# Patient Record
Sex: Male | Born: 1955 | Race: White | Hispanic: No | Marital: Married | Smoking: Never smoker
Health system: Southern US, Community
[De-identification: ages and names within clinical notes are randomized; demographics above are authoritative.]

## PROBLEM LIST (undated history)

## (undated) DIAGNOSIS — E7800 Pure hypercholesterolemia, unspecified: Secondary | ICD-10-CM

---

## 2011-10-30 ENCOUNTER — Encounter: Payer: Self-pay | Admitting: *Deleted

## 2011-10-30 ENCOUNTER — Emergency Department (HOSPITAL_BASED_OUTPATIENT_CLINIC_OR_DEPARTMENT_OTHER)
Admission: EM | Admit: 2011-10-30 | Discharge: 2011-10-30 | Disposition: A | Discharge status: Home / Self Care | Payer: No Typology Code available for payment source | Attending: Emergency Medicine | Admitting: Emergency Medicine

## 2011-10-30 ENCOUNTER — Other Ambulatory Visit: Payer: Self-pay

## 2011-10-30 ENCOUNTER — Emergency Department (INDEPENDENT_AMBULATORY_CARE_PROVIDER_SITE_OTHER): Payer: No Typology Code available for payment source

## 2011-10-30 DIAGNOSIS — R42 Dizziness and giddiness: Secondary | ICD-10-CM

## 2011-10-30 DIAGNOSIS — E7800 Pure hypercholesterolemia, unspecified: Secondary | ICD-10-CM | POA: Insufficient documentation

## 2011-10-30 DIAGNOSIS — R079 Chest pain, unspecified: Secondary | ICD-10-CM | POA: Insufficient documentation

## 2011-10-30 HISTORY — DX: Pure hypercholesterolemia, unspecified: E78.00

## 2011-10-30 LAB — CARDIAC PANEL(CRET KIN+CKTOT+MB+TROPI)
Relative Index: INVALID (ref 0.0–2.5)
Relative Index: INVALID (ref 0.0–2.5)
Total CK: 86 U/L (ref 7–232)
Total CK: 81 U/L (ref 7–232)
Troponin I: <0.3 ng/mL (ref <0.30)
Troponin I: <0.3 ng/mL (ref <0.30)

## 2011-10-30 LAB — DIFFERENTIAL
Lymphocytes Relative: 18 % (ref 12–46)
Lymphs Abs: 1.8 10*3/uL (ref 0.7–4.0)
Monocytes Absolute: 0.8 10*3/uL (ref 0.1–1.0)
Monocytes Relative: 8 % (ref 3–12)
Neutro Abs: 7.3 10*3/uL (ref 1.7–7.7)
Neutrophils Relative %: 73 % (ref 43–77)

## 2011-10-30 LAB — BASIC METABOLIC PANEL
BUN: 14 mg/dL (ref 6–23)
CO2: 29 mEq/L (ref 19–32)
Chloride: 102 mEq/L (ref 96–112)
Creatinine, Ser: 0.8 mg/dL (ref 0.50–1.35)
GFR calc Af Amer: >90 mL/min (ref >90)
Potassium: 4.1 mEq/L (ref 3.5–5.1)

## 2011-10-30 LAB — CBC
HCT: 43 % (ref 39.0–52.0)
Hemoglobin: 15.2 g/dL (ref 13.0–17.0)
RBC: 4.94 MIL/uL (ref 4.22–5.81)
WBC: 10 10*3/uL (ref 4.0–10.5)

## 2011-10-30 MED ORDER — ONDANSETRON HCL 4 MG/2ML IJ SOLN
4.0000 mg | Freq: Once | INTRAMUSCULAR | Status: AC
Start: 2011-10-30 — End: 2011-10-30
  Administered 2011-10-30: 4 mg via INTRAVENOUS
  Filled 2011-10-30: qty 2

## 2011-10-30 MED ORDER — GI COCKTAIL ~~LOC~~
30.0000 mL | Freq: Once | ORAL | Status: AC
  Administered 2011-10-30: 30 mL via ORAL
  Filled 2011-10-30: qty 30

## 2011-10-30 MED ORDER — ASPIRIN 81 MG PO CHEW: 324.0000 mg | CHEWABLE_TABLET | Freq: Once | ORAL | Status: DC

## 2011-10-30 NOTE — ED Notes (Signed)
Pt took asa prior to arrival

## 2011-10-30 NOTE — ED Notes (Signed)
EDP was back in with pt-pt cont'd denies c/o

## 2011-10-30 NOTE — ED Notes (Signed)
PT TO HALL BED 1 BY EMS VIA STRETCHER, PT REPORTS ONE HOUR LONG EPISODE OF CHEST PRESSURE, DIAHPHORESIS, AND NAUSEA, PT STATES HE TOOK ZANTAC AND FELT HE GOT SOME RELIEF FROM IT. PT HAD ANOTHER EPISODE TODAY, NO CHEST PRESSURE, BUT FELT "FLUSHED" AND NAUSEATED. PER PT COMPANION HE LOOKED "SWEATY", AND PT STATES HE "FELT FUNNY". EMS REPORTS EKG SHOWS NSR, REPEAT EKG IN PROGRESS WHILE PT BEING TRIAGED.

## 2011-10-30 NOTE — ED Provider Notes (Signed)
History  This chart was scribed for Glynn Octave, MD by Bennett Scrape. This patient was seen in room MH10/MH10 and the patient's care was started at 4:50PM.  CSN: [id number] Arrival date & time: 10/30/2011  4:39 PM   First MD Initiated Contact with Patient 10/30/11 1639      Chief Complaint  Patient presents with  . Chest Pain     The history is provided by the patient. No language interpreter was used.   Roberto Taylor is a 55 y.o. male brought in by ambulance, who presents to the Emergency Department complaining of two episodes of one hour sharp and pressure chest pain felt across the top part of the chest, diaphoresis and  light-headedness with one episode of diarrhea that occurred over the past 3 days. The first episode started while the pt was asleep. Pt states that he took a Zantac during the first episode 2 days ago with improvement in his symptoms. Pt states that he had no symptoms yesterday. Pt states that he experienced the same symptoms today after attending some meetings in [facility] about 2.5 hours ago. Pt states that he excused himself to go to the bathroom after he began to feel diaphoretic and lightheaded. He states that he started falling when came out of the bathroom, but was caught by a co-worker who then drove him to an Urgent Care in [place]. An EKG was performed there and he was then transferred to this ED. Pt states that he has a h/o heart disease in his family, but denies  having any h/o heart problems himself. Pt states that he had negative stress test 12 years ago. Pt denies smoking and alcohol use.   Pt's PCP is Dr. Forestine Na.  Past Medical History  Diagnosis Date  . Hypercholesteremia     History reviewed. No pertinent past surgical history.  History reviewed. No pertinent family history.  History  Substance Use Topics  . Smoking status: Never Smoker   . Smokeless tobacco: Not on file  . Alcohol Use: No      Review of Systems A complete 10  system review of systems was obtained and is otherwise negative except as noted in the HPI.   Allergies  Review of patient's allergies indicates no known allergies.  Home Medications   Current Outpatient Rx  Name Route Sig Dispense Refill  . ASPIRIN 81 MG PO CHEW Oral Chew 324 mg by mouth once.      Marland Kitchen RANITIDINE HCL 75 MG PO TABS Oral Take 75 mg by mouth once.      . INFLUENZA VIRUS VACC SPLIT PF IM SUSP Intramuscular Inject 0.5 mLs into the muscle once.        Triage Vitals: BP 121/73  Pulse 91  Temp(Src) 97.5 F (36.4 C) (Oral)  Resp 20  SpO2 98%  Physical Exam  Nursing note and vitals reviewed. Constitutional: He is oriented to person, place, and time. He appears well-developed and well-nourished.  HENT:  Head: Normocephalic and atraumatic.  Eyes: Conjunctivae and EOM are normal. Pupils are equal, round, and reactive to light.  Neck: Normal range of motion. Neck supple.  Cardiovascular: Normal rate, regular rhythm and normal heart sounds.   Pulmonary/Chest: Effort normal and breath sounds normal.  Abdominal: Soft. Bowel sounds are normal. There is no tenderness.  Musculoskeletal: Normal range of motion. He exhibits no edema.  Neurological: He is alert and oriented to person, place, and time.  Skin: Skin is warm and dry.  ED Course  Procedures (including critical care time)  DIAGNOSTIC STUDIES: Oxygen Saturation is 98% on room air, normal by my interpretation.    COORDINATION OF CARE: 4:54PM-Discussed possible admission Requested to be transferred to [facility] if necessary because of family. 5:54PM-Discussed negative test results with pt and pt acknowledged results. Advised pt to be seen at [facility]. Pt would rather be sent home and be seen tomorrow by PCP. Agreed upon another set of blood tests and then discharge pt home. Pt will be seen as an outpatient tomorrow for stress test. 8:49PM-Discussed negative test findings. Pt is feeling better and is comfortable going  home. Pt will follow-up with PCP within the next week and will seek out a Cardiologist.   Labs Reviewed  CBC  DIFFERENTIAL  BASIC METABOLIC PANEL  CARDIAC PANEL(CRET KIN+CKTOT+MB+TROPI)  CARDIAC PANEL(CRET KIN+CKTOT+MB+TROPI)   Dg Chest 2 View  10/30/2011  *RADIOLOGY REPORT*  Clinical Data: Chest pains, lightheadedness, sweating  CHEST - 2 VIEW  Comparison: None.  Findings: The lungs are clear.  Mediastinal contours appear normal. The heart is within normal limits in size.  No bony abnormality is seen.  IMPRESSION: No active lung disease.  Original Report Authenticated By: Juline Patch, M.D.     1. Chest pain       MDM  Patient presents with 2 episodes of chest pressure, nausea, diaphoresis, lightheadedness and flushed feeling. States he is feeling better but still there but at baseline. He currently denies any chest pain. The symptoms are concerning for anginal equivalent.   Date: 10/30/2011  Rate: 89  Rhythm: normal sinus rhythm  QRS Axis: normal  Intervals: normal  ST/T Wave abnormalities: normal  Conduction Disutrbances:none  Narrative Interpretation:   Old EKG Reviewed: none available  First set of enzymes negative patient is feeling better.  I discussed with him my low suspicion for cardiac etiology of his discomfort. Patient's unwilling to stay for any type of provocative testing.  His TIMI score is 0. his heart score is 1.  He states he'll followup with his doctor for an outpatient stress test he is agreeable to obtain a second set of cardiac enzymes.  This meets the [facility] guidelines for low risk chest pain with followup provocative testing in 48-72 hours.  Two sets of enzymes negative.  Patient feeling at baseline.  No further symptoms.  States GI cocktail helped.  Still not agreeable tos staying overnight.  He states he will follow up with his doctor tomorrow.   I personally performed the services described in this documentation, which was scribed in my presence.  The  recorded information has been reviewed and considered.   Glynn Octave, MD 10/31/11 [phone]

## 2012-11-13 IMAGING — CR DG CHEST 2V
2 series · 2 of 2 positions shown · non-contrast
Comparison: None.

CLINICAL DATA: Chest pains, lightheadedness, sweating

CHEST - 2 VIEW

[w chest pa]
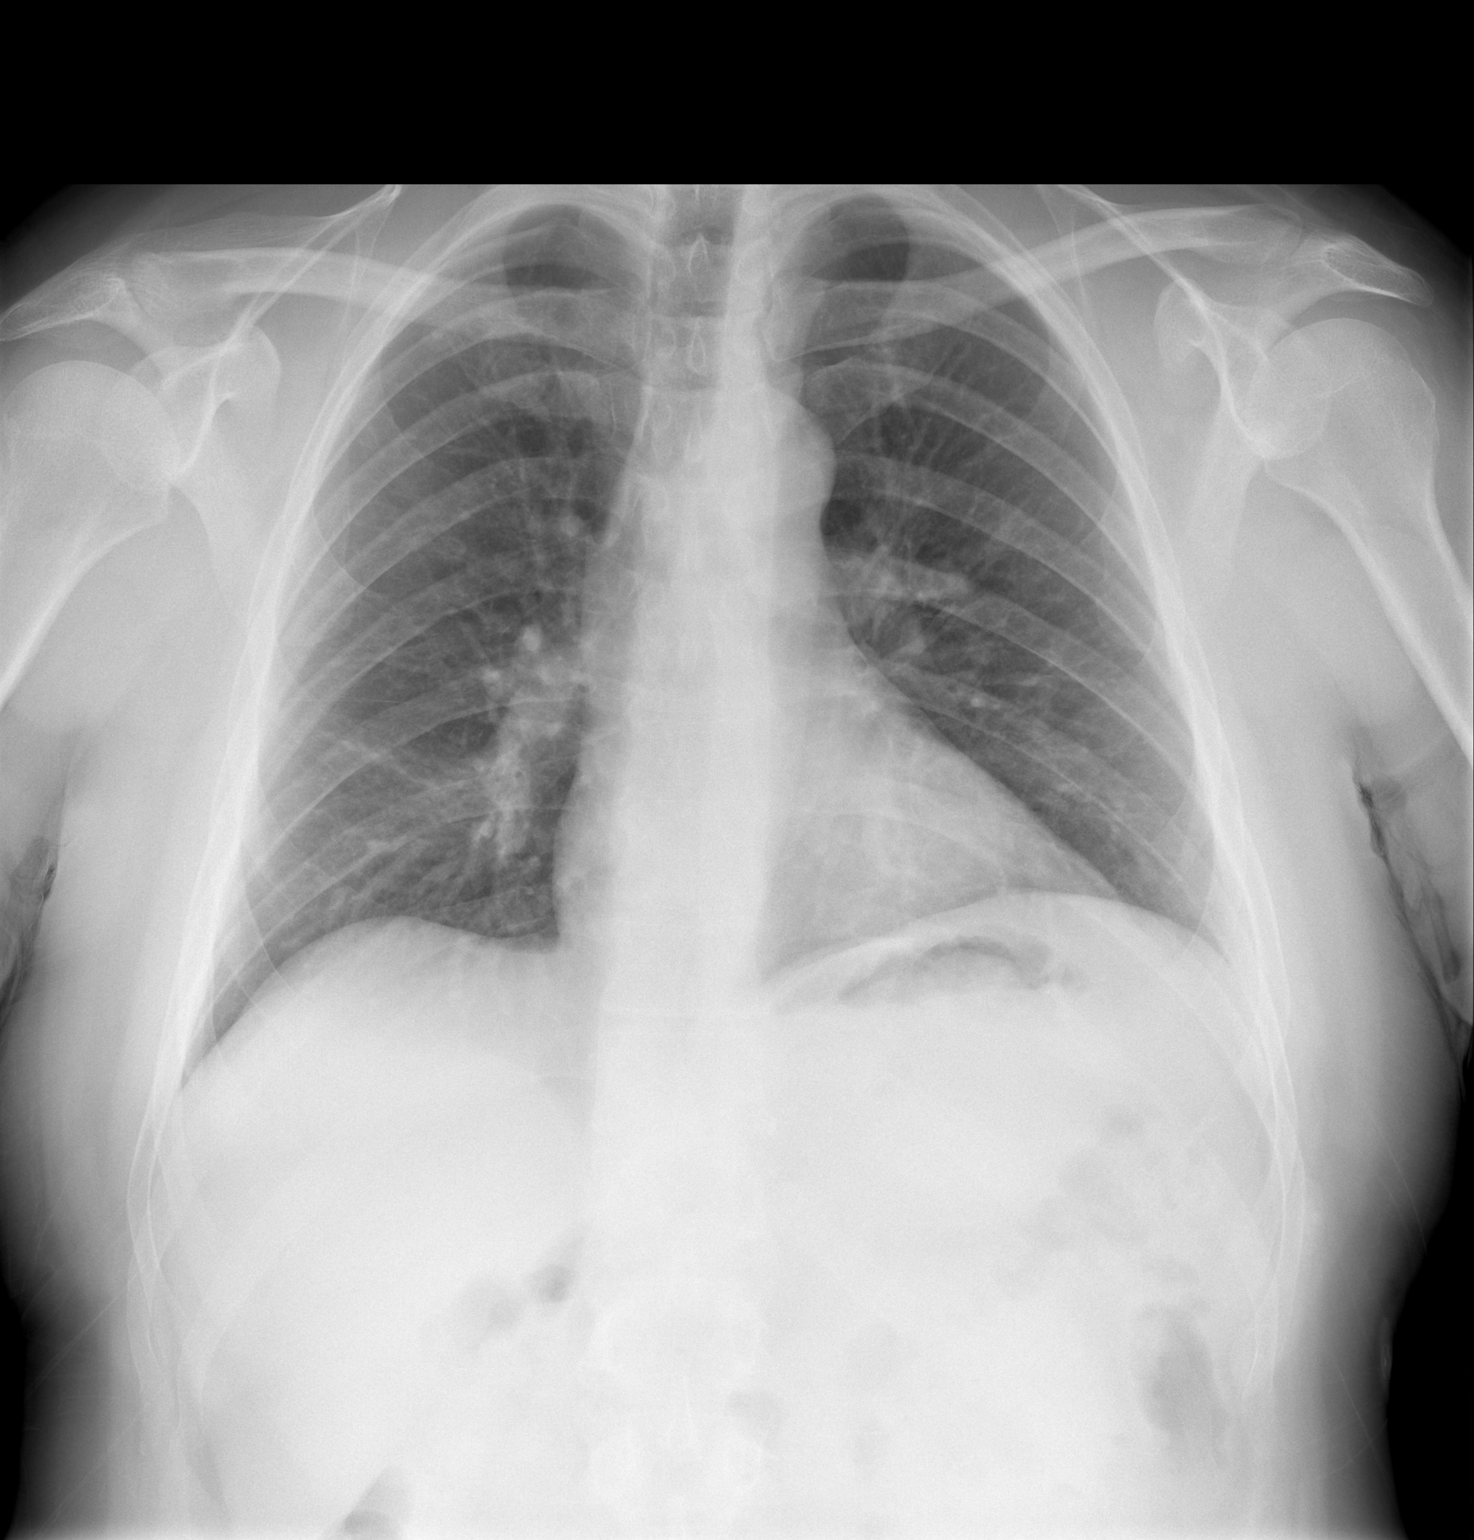

[w chest lat]
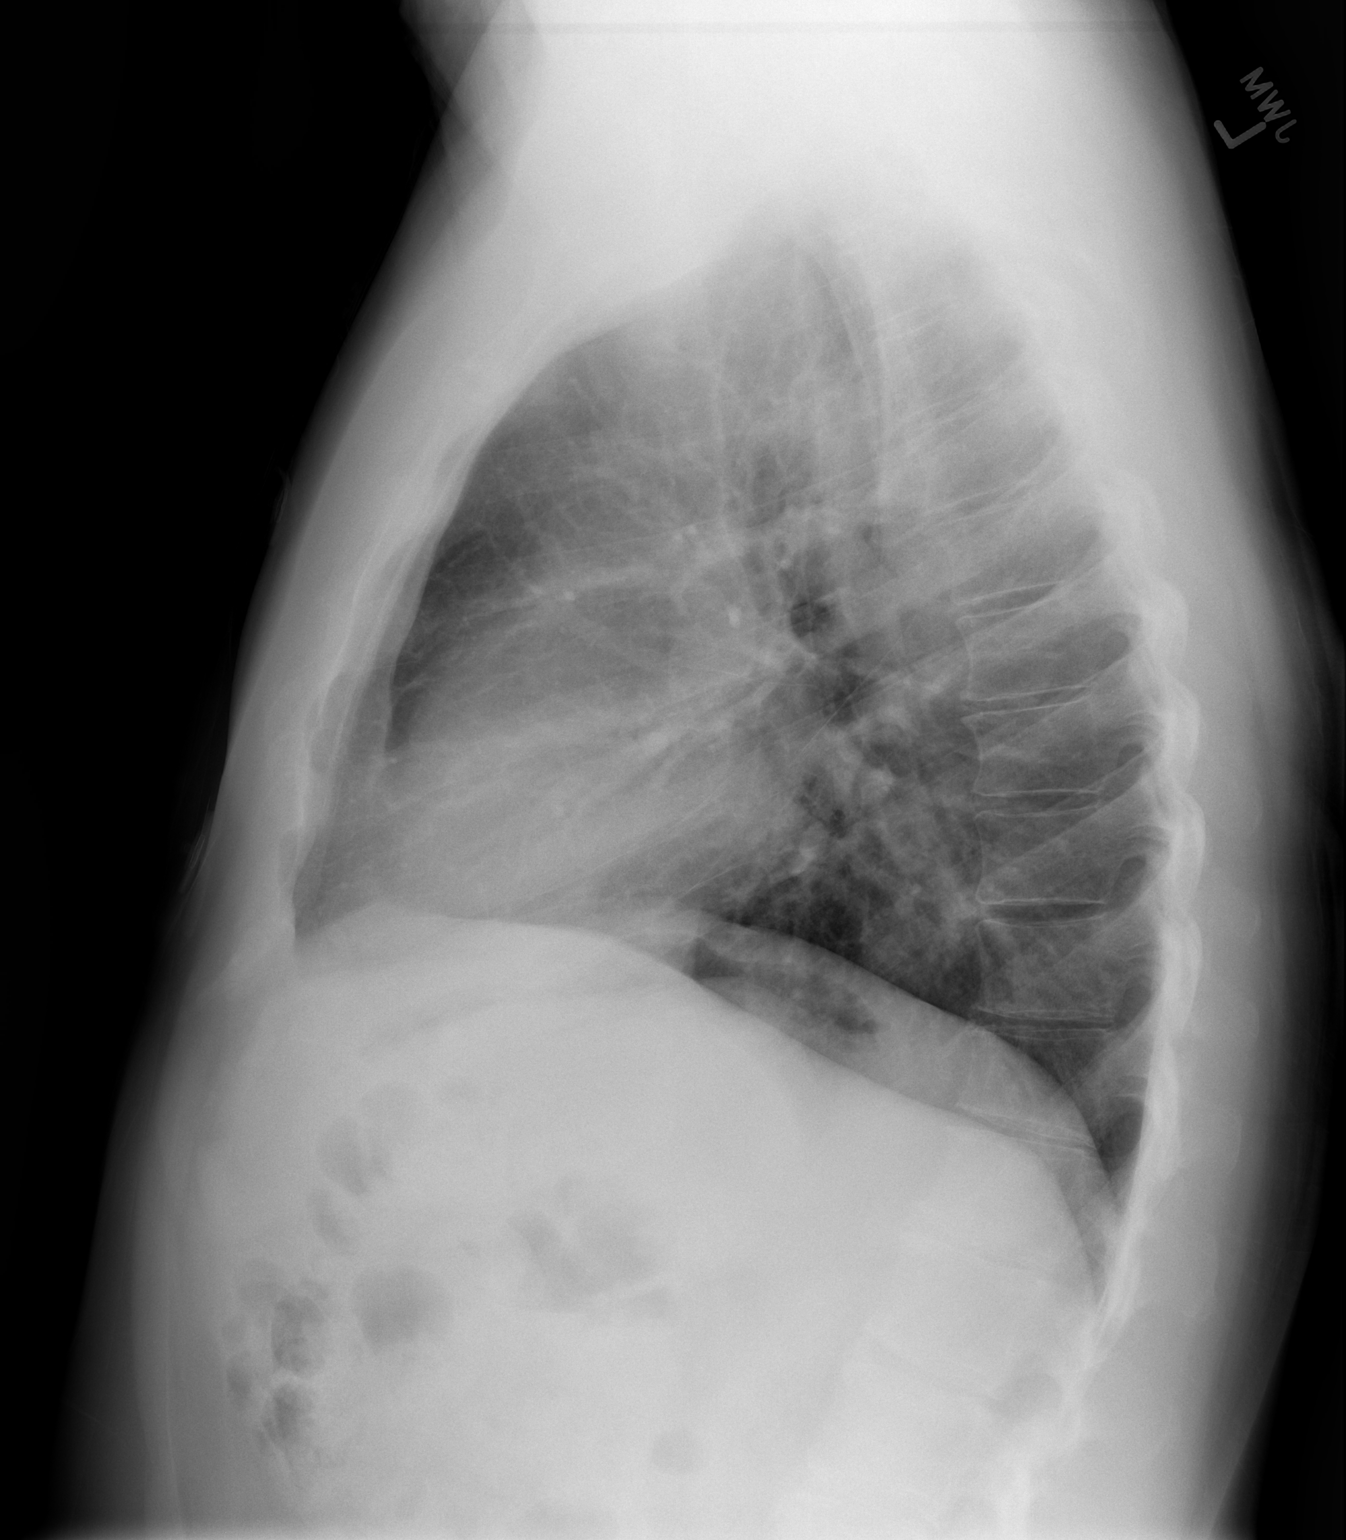

[2 of 2 positions shown; findings below may reference images not displayed]

FINDINGS: The lungs are clear.  Mediastinal contours appear normal.
The heart is within normal limits in size.  No bony abnormality is
seen.
IMPRESSION: No active lung disease.

## 2020-08-18 DIAGNOSIS — C61 Malignant neoplasm of prostate: Secondary | ICD-10-CM | POA: Insufficient documentation

## 2022-06-12 NOTE — Progress Notes (Signed)
[facility] [place], [place] [address]  Pulmonary Sleep Medicine   Office Visit Note  Patient Name: Roberto Taylor DOB: March 17, 1956 MRN [id number]    Chief Complaint: Obstructive Sleep Apnea visit  Brief History:  Amias is seen today for an initial consult for CPAP@ 10 cmH2O. The patient has a 5 year history of sleep apnea. Patient is using PAP nightly.  The patient feels rested after sleeping with PAP.  The patient reports benefiting from PAP use. Reported sleepiness is  improved and the Epworth Sleepiness Score is 3 out of 24. The patient continues to require PAP therapy in order to eliminate his sleep apnea. The patient does not take naps. The patient complains of the following: some oral dryness. Mask leak noted and he is now going to get on a schedule for changing out supplies.  The compliance download shows 90% compliance with an average use time of 7 hours. The AHI is 4.8.  The patient does not complain of limb movements disrupting sleep. Prior to CPAP would doze off easily, have daytime sleepiness, snore, and had pauses in breathing at night.  ROS  General: (-) fever, (-) chills, (-) night sweat Nose and Sinuses: (-) nasal stuffiness or itchiness, (-) postnasal drip, (-) nosebleeds, (-) sinus trouble. Mouth and Throat: (-) sore throat, (-) hoarseness. Neck: (-) swollen glands, (-) enlarged thyroid, (-) neck pain. Respiratory: - cough, - shortness of breath, - wheezing. Neurologic: + numbness, + tingling. Psychiatric: - anxiety, - depression   Current Medication: Outpatient Encounter Medications as of 06/13/2022  Medication Sig   [DISCONTINUED] aspirin 81 MG chewable tablet Chew 324 mg by mouth once.     [DISCONTINUED] influenza  inactive virus vaccine (FLUZONE/FLUARIX) injection Inject 0.5 mLs into the muscle once.     [DISCONTINUED] ranitidine (ZANTAC) 75 MG tablet Take 75 mg by mouth once.     No facility-administered encounter medications on  file as of 06/13/2022.    Surgical History: History reviewed. No pertinent surgical history.  Medical History: Past Medical History:  Diagnosis Date   Hypercholesteremia     Family History: Non contributory to the present illness  Social History: Social History   Socioeconomic History   Marital status: Married    Spouse name: Not on file   Number of children: Not on file   Years of education: Not on file   Highest education level: Not on file  Occupational History   Not on file  Tobacco Use   Smoking status: Never   Smokeless tobacco: Not on file  Substance and Sexual Activity   Alcohol use: No   Drug use: No   Sexual activity: Not on file  Other Topics Concern   Not on file  Social History Narrative   Not on file   Social Determinants of Health   Financial Resource Strain: Not on file  Food Insecurity: Not on file  Transportation Needs: Not on file  Physical Activity: Not on file  Stress: Not on file  Social Connections: Not on file  Intimate Partner Violence: Not on file    Vital Signs: Blood pressure 112/63, pulse (!) 56, resp. rate 16, height '5\' 9"'$  (1.753 m), weight 182 lb 14.4 oz (83 kg), SpO2 97 %. Body mass index is 27.01 kg/m.    Examination: General Appearance: The patient is well-developed, well-nourished, and in no distress. Neck Circumference: 40cm Skin: Gross inspection of skin unremarkable. Head: normocephalic, no gross deformities. Eyes: no gross deformities noted. ENT: ears appear  grossly normal Neurologic: Alert and oriented. No involuntary movements.    EPWORTH SLEEPINESS SCALE:  Scale:  (0)= no chance of dozing; (1)= slight chance of dozing; (2)= moderate chance of dozing; (3)= high chance of dozing  Chance  Situtation    Sitting and reading: 1    Watching TV: 1    Sitting Inactive in public: 0    As a passenger in car: 0      Lying down to rest: 1    Sitting and talking: 0    Sitting quielty after lunch: 0    In  a car, stopped in traffic: 0   TOTAL SCORE:   3 out of 24    SLEEP STUDIES:  Split Study (10/2017) AHI 65/hr, min SpO2 84% , CPAP@ 10 cmH2O   CPAP COMPLIANCE DATA:  Date Range: 05/13/2022  Average Daily Use: 7 hours  Median Use: 6 hours 57 minutes  Compliance for > 4 Hours: 90%  AHI: 4.8 respiratory events per hour  Days Used: 27/30 days  Mask Leak: 32.8  95th Percentile Pressure: 10         LABS: No results found for this or any previous visit (from the past 2160 hour(s)).  Radiology: DG Chest 2 View  Result Date: 10/30/2011 *RADIOLOGY REPORT* Clinical Data: Chest pains, lightheadedness, sweating CHEST - 2 VIEW Comparison: None. Findings: The lungs are clear.  Mediastinal contours appear normal. The heart is within normal limits in size.  No bony abnormality is seen. IMPRESSION: No active lung disease. Original Report Authenticated By: Joretta Bachelor, M.D.   No results found.  No results found.    Assessment and Plan: Patient Active Problem List   Diagnosis Date Noted   GERD (gastroesophageal reflux disease) 06/13/2022   Prostate cancer ([place]) 08/18/2020      The patient does tolerate PAP and reports benefit from PAP use. The patient was reminded how to adjust mask fit and advised to change supplies regularly. The patient was also counselled on nightly use. The compliance is excellent. The AHI is 4.8. Pt is going to be set up for regular supplies now and this should help leak. Will have 4 month follow up to ensure AHI stays controlled. Pt continues to require CPAP to treat his Osa and is medically necessary.   1. OSA (obstructive sleep apnea) Continue excellent compliance  2. CPAP use counseling CPAP couseling-Discussed importance of adequate CPAP use as well as proper care and cleaning techniques of machine and all supplies.  3. H/O prostate cancer Followed by urology   General Counseling: I have discussed the findings of the evaluation and  examination with [name].  I have also discussed any further diagnostic evaluation thatmay be needed or ordered today. Osiel verbalizes understanding of the findings of todays visit. We also reviewed his medications today and discussed drug interactions and side effects including but not limited excessive drowsiness and altered mental states. We also discussed that there is always a risk not just to him but also people around him. he has been encouraged to call the office with any questions or concerns that should arise related to todays visit.  No orders of the defined types were placed in this encounter.       I have personally obtained a history, examined the patient, evaluated laboratory and imaging results, formulated the assessment and plan and placed orders.  This patient was seen by Drema Dallas, PA-C in collaboration with Dr. Devona Konig as a part of collaborative care  agreement.  Allyne Gee, MD [facility] Diplomate ABMS Pulmonary Critical Care Medicine and Sleep Medicine

## 2022-06-13 ENCOUNTER — Ambulatory Visit (INDEPENDENT_AMBULATORY_CARE_PROVIDER_SITE_OTHER): Payer: Medicare Other | Admitting: Internal Medicine

## 2022-06-13 VITALS — BP 112/63 | HR 56 | Resp 16 | Ht 69.0 in | Wt 182.9 lb

## 2022-06-13 DIAGNOSIS — Z8546 Personal history of malignant neoplasm of prostate: Secondary | ICD-10-CM

## 2022-06-13 DIAGNOSIS — G4733 Obstructive sleep apnea (adult) (pediatric): Secondary | ICD-10-CM | POA: Diagnosis not present

## 2022-06-13 DIAGNOSIS — Z7189 Other specified counseling: Secondary | ICD-10-CM

## 2022-06-13 DIAGNOSIS — K219 Gastro-esophageal reflux disease without esophagitis: Secondary | ICD-10-CM | POA: Insufficient documentation

## 2022-06-13 NOTE — Patient Instructions (Signed)

## 2022-10-10 NOTE — Progress Notes (Signed)
[facility] [place], [place] [address]  Pulmonary Sleep Medicine   Office Visit Note  Patient Name: Roberto Taylor DOB: 1956/10/21 MRN [id number]    Chief Complaint: Obstructive Sleep Apnea visit  Brief History:  Roberto Taylor is seen today for a follow up visit for CPAP@ 10 cmH2O. The patient has a 5 year history of sleep apnea. Patient is using PAP nightly.  The patient feels rested after sleeping with PAP.  The patient reports benefiting from PAP use. Reported sleepiness is  improved and the Epworth Sleepiness Score is 7 out of 24. The patient does not take naps. The patient complains of the following: no complaints regarding his therapy.  The compliance download shows 77% compliance with an average use time of 7 hours 11 minutes. The AHI is 6.3.  The patient does not complain of limb movements disrupting sleep. The patient continues to require PAP therapy in order to eliminate sleep apnea.   ROS  General: (-) fever, (-) chills, (-) night sweat Nose and Sinuses: (-) nasal stuffiness or itchiness, (-) postnasal drip, (-) nosebleeds, (-) sinus trouble. Mouth and Throat: (-) sore throat, (-) hoarseness. Neck: (-) swollen glands, (-) enlarged thyroid, (-) neck pain. Respiratory: - cough, - shortness of breath, - wheezing. Neurologic: - numbness, - tingling. Psychiatric: - anxiety, - depression   Current Medication: No outpatient encounter medications on file as of 10/11/2022.   No facility-administered encounter medications on file as of 10/11/2022.    Surgical History: History reviewed. No pertinent surgical history.  Medical History: Past Medical History:  Diagnosis Date   Hypercholesteremia     Family History: Non contributory to the present illness  Social History: Social History   Socioeconomic History   Marital status: Married    Spouse name: Not on file   Number of children: Not on file   Years of education: Not on file   Highest  education level: Not on file  Occupational History   Not on file  Tobacco Use   Smoking status: Never   Smokeless tobacco: Not on file  Substance and Sexual Activity   Alcohol use: No   Drug use: No   Sexual activity: Not on file  Other Topics Concern   Not on file  Social History Narrative   Not on file   Social Determinants of Health   Financial Resource Strain: Not on file  Food Insecurity: Not on file  Transportation Needs: Not on file  Physical Activity: Not on file  Stress: Not on file  Social Connections: Not on file  Intimate Partner Violence: Not on file    Vital Signs: Blood pressure 109/67, pulse 64, resp. rate 16, height '5\' 9"'$  (1.753 m), weight 178 lb (80.7 kg), SpO2 96 %. Body mass index is 26.29 kg/m.    Examination: General Appearance: The patient is well-developed, well-nourished, and in no distress. Neck Circumference: 40 cm Skin: Gross inspection of skin unremarkable. Head: normocephalic, no gross deformities. Eyes: no gross deformities noted. ENT: ears appear grossly normal Neurologic: Alert and oriented. No involuntary movements.  STOP BANG RISK ASSESSMENT S (snore) Have you been told that you snore?     No   T (tired) Are you often tired, fatigued, or sleepy during the day?   NO  O (obstruction) Do you stop breathing, choke, or gasp during sleep? NO   P (pressure) Do you have or are you being treated for high blood pressure? NO   B (BMI) Is your body index  greater than 35 kg/m? NO   A (age) Are you 53 years old or older? YES   N (neck) Do you have a neck circumference greater than 16 inches?   NO   G (gender) Are you a male? YES   TOTAL STOP/BANG "YES" ANSWERS 2       A STOP-Bang score of 2 or less is considered low risk, and a score of 5 or more is high risk for having either moderate or severe OSA. For people who score 3 or 4, doctors may need to perform further assessment to determine how likely they are to have OSA.          EPWORTH SLEEPINESS SCALE:  Scale:  (0)= no chance of dozing; (1)= slight chance of dozing; (2)= moderate chance of dozing; (3)= high chance of dozing  Chance  Situtation    Sitting and reading: 1    Watching TV: 1    Sitting Inactive in public: 0    As a passenger in car: 1      Lying down to rest: 2    Sitting and talking: 0    Sitting quielty after lunch: 2    In a car, stopped in traffic: 0   TOTAL SCORE:   7 out of 24    SLEEP STUDIES:  Split Study (10/2017) AHI 65/hr, min Spo2 84%, CPAP@ 10 cmH2O   CPAP COMPLIANCE DATA:  Date Range: 05/30/2022-08/27/2022  Average Daily Use: 7 hours 11 minutes  Median Use: 7 hours 8 minutes  Compliance for > 4 Hours: 77%  AHI: 6.3 respiratory events per hour  Days Used: 69/90 days  Mask Leak: 30.3  95th Percentile Pressure: 10         LABS: No results found for this or any previous visit (from the past 2160 hour(s)).  Radiology: DG Chest 2 View  Result Date: 10/30/2011 *RADIOLOGY REPORT* Clinical Data: Chest pains, lightheadedness, sweating CHEST - 2 VIEW Comparison: None. Findings: The lungs are clear.  Mediastinal contours appear normal. The heart is within normal limits in size.  No bony abnormality is seen. IMPRESSION: No active lung disease. Original Report Authenticated By: Joretta Bachelor, M.D.   No results found.  No results found.    Assessment and Plan: Patient Active Problem List   Diagnosis Date Noted   GERD (gastroesophageal reflux disease) 06/13/2022   Prostate cancer ([place]) 08/18/2020      The patient does tolerate PAP and reports benefit from PAP use. The patient was reminded how to adjust mask fit and advised to change supplies regularly. The patient was also counselled on nightly use. The compliance is fair, but patient has second machine he uses to fill gaps. The AHI is 6.3 with some leak. Will adjust to APAP 10-15cm H2O and check 2 week remote download to ensure  improving.   1. OSA (obstructive sleep apnea) Continue nightly use  2. CPAP use counseling CPAP couseling-Discussed importance of adequate CPAP use as well as proper care and cleaning techniques of machine and all supplies.    General Counseling: I have discussed the findings of the evaluation and examination with Roberto Taylor.  I have also discussed any further diagnostic evaluation thatmay be needed or ordered today. Sol verbalizes understanding of the findings of todays visit. We also reviewed his medications today and discussed drug interactions and side effects including but not limited excessive drowsiness and altered mental states. We also discussed that there is always a risk not just to him but  also people around him. he has been encouraged to call the office with any questions or concerns that should arise related to todays visit.  No orders of the defined types were placed in this encounter.       I have personally obtained a history, examined the patient, evaluated laboratory and imaging results, formulated the assessment and plan and placed orders.  This patient was seen by Drema Dallas, PA-C in collaboration with Dr. Devona Konig as a part of collaborative care agreement.  Allyne Gee, MD [facility] Diplomate ABMS Pulmonary Critical Care Medicine and Sleep Medicine

## 2022-10-11 ENCOUNTER — Ambulatory Visit (INDEPENDENT_AMBULATORY_CARE_PROVIDER_SITE_OTHER): Payer: Medicare Other | Admitting: Internal Medicine

## 2022-10-11 VITALS — BP 109/67 | HR 64 | Resp 16 | Ht 69.0 in | Wt 178.0 lb

## 2022-10-11 DIAGNOSIS — Z7189 Other specified counseling: Secondary | ICD-10-CM

## 2022-10-11 DIAGNOSIS — G4733 Obstructive sleep apnea (adult) (pediatric): Secondary | ICD-10-CM

## 2022-10-11 NOTE — Patient Instructions (Signed)

## 2024-04-02 ENCOUNTER — Ambulatory Visit (INDEPENDENT_AMBULATORY_CARE_PROVIDER_SITE_OTHER): Admitting: Internal Medicine

## 2024-04-02 VITALS — BP 109/68 | HR 55 | Resp 16 | Ht 68.0 in | Wt 186.0 lb

## 2024-04-02 DIAGNOSIS — G4733 Obstructive sleep apnea (adult) (pediatric): Secondary | ICD-10-CM

## 2024-04-02 DIAGNOSIS — Z7189 Other specified counseling: Secondary | ICD-10-CM | POA: Diagnosis not present

## 2024-04-02 NOTE — Progress Notes (Signed)
 [facility] [address] [place], [place] [address]  Pulmonary Sleep Medicine   Office Visit Note  Patient Name: Roberto Taylor DOB: 1956-11-01 MRN [id number]    Chief Complaint: Obstructive Sleep Apnea visit  Brief History:  Sevren is seen today for an annual follow up visit for APAP@ 10-15 cmH2O. The patient has a 7 year history of sleep apnea. Patient is using PAP nightly.  The patient feels rested after sleeping with PAP.  The patient reports benefiting from PAP use. Reported sleepiness is  improved and the Epworth Sleepiness Score is 9 out of 24. The patient  does not take naps. The patient complains of the following: none.  The compliance download shows 87% compliance with an average use time of 7 hours 22 minutes. The AHI is 3.3.  The patient does not complain of limb movements disrupting sleep. The patient continues to require PAP therapy in order to eliminate sleep apnea.   ROS  General: (-) fever, (-) chills, (-) night sweat Nose and Sinuses: (-) nasal stuffiness or itchiness, (-) postnasal drip, (-) nosebleeds, (-) sinus trouble. Mouth and Throat: (-) sore throat, (-) hoarseness. Neck: (-) swollen glands, (-) enlarged thyroid, (-) neck pain. Respiratory: - cough, - shortness of breath, - wheezing. Neurologic: - numbness, - tingling. Psychiatric: - anxiety, - depression   Current Medication: No outpatient encounter medications on file as of 04/02/2024.   No facility-administered encounter medications on file as of 04/02/2024.    Surgical History: History reviewed. No pertinent surgical history.  Medical History: Past Medical History:  Diagnosis Date   Hypercholesteremia     Family History: Non contributory to the present illness  Social History: Social History   Socioeconomic History   Marital status: Married    Spouse name: Not on file   Number of children: Not on file   Years of education: Not on file   Highest education level: Not on file   Occupational History   Not on file  Tobacco Use   Smoking status: Never   Smokeless tobacco: Not on file  Substance and Sexual Activity   Alcohol use: No   Drug use: No   Sexual activity: Not on file  Other Topics Concern   Not on file  Social History Narrative   Not on file   Social Drivers of Health   Financial Resource Strain: Not on file  Food Insecurity: Not on file  Transportation Needs: Not on file  Physical Activity: Not on file  Stress: Not on file  Social Connections: Not on file  Intimate Partner Violence: Not on file    Vital Signs: Blood pressure 109/68, pulse (!) 55, resp. rate 16, height 5\' 8"  (1.727 m), weight 186 lb (84.4 kg), SpO2 99%. Body mass index is 28.28 kg/m.    Examination: General Appearance: The patient is well-developed, well-nourished, and in no distress. Neck Circumference: 40 cm Skin: Gross inspection of skin unremarkable. Head: normocephalic, no gross deformities. Eyes: no gross deformities noted. ENT: ears appear grossly normal Neurologic: Alert and oriented. No involuntary movements.  STOP BANG RISK ASSESSMENT S (snore) Have you been told that you snore?     NO   T (tired) Are you often tired, fatigued, or sleepy during the day?   NO  O (obstruction) Do you stop breathing, choke, or gasp during sleep? NO   P (pressure) Do you have or are you being treated for high blood pressure? NO   B (BMI) Is your body index greater than  35 kg/m? NO   A (age) Are you 67 years old or older? YES   N (neck) Do you have a neck circumference greater than 16 inches?   NO   G (gender) Are you a male? YES   TOTAL STOP/BANG "YES" ANSWERS 2       A STOP-Bang score of 2 or less is considered low risk, and a score of 5 or more is high risk for having either moderate or severe OSA. For people who score 3 or 4, doctors may need to perform further assessment to determine how likely they are to have OSA.         EPWORTH SLEEPINESS SCALE:  Scale:   (0)= no chance of dozing; (1)= slight chance of dozing; (2)= moderate chance of dozing; (3)= high chance of dozing  Chance  Situtation    Sitting and reading: 2    Watching TV: 2    Sitting Inactive in public: 1    As a passenger in car: 1      Lying down to rest: 2    Sitting and talking: 0    Sitting quielty after lunch: 1    In a car, stopped in traffic: 0   TOTAL SCORE:   9 out of 24    SLEEP STUDIES:  Split Study (10/2017) AHI 65/hr, min SpO2 84%, CPAP@ 10 cmH2O   CPAP COMPLIANCE DATA:  Date Range: 04/02/2023-03/31/2024  Average Daily Use: 7 hours 22 minutes  Median Use: 7 hours 22 minutes  Compliance for > 4 Hours: 87%  AHI: 3.3 respiratory events per hour  Days Used: 320/365 days  Mask Leak: 33.1  95th Percentile Pressure: 12.4         LABS: No results found for this or any previous visit (from the past 2160 hours).  Radiology: DG Chest 2 View Result Date: 10/30/2011 *RADIOLOGY REPORT* Clinical Data: Chest pains, lightheadedness, sweating CHEST - 2 VIEW Comparison: None. Findings: The lungs are clear.  Mediastinal contours appear normal. The heart is within normal limits in size.  No bony abnormality is seen. IMPRESSION: No active lung disease. Original Report Authenticated By: Elwanda Hammock, M.D.   No results found.  No results found.    Assessment and Plan: Patient Active Problem List   Diagnosis Date Noted   GERD (gastroesophageal reflux disease) 06/13/2022   Prostate cancer (HCC) 08/18/2020      The patient does tolerate PAP and reports benefit from PAP use. The patient was reminded how to adjust mask fit and advised to change supplies regularly. The patient was also counselled on nightly use. The compliance is good. The AHI is 3.3. Patient continues to require PAP to treat their apnea and is medically necessary.  1. OSA (obstructive sleep apnea) (Primary) Continue nightly use  2. CPAP use counseling CPAP  couseling-Discussed importance of adequate CPAP use as well as proper care and cleaning techniques of machine and all supplies.    General Counseling: I have discussed the findings of the evaluation and examination with Gwinda Leopard.  I have also discussed any further diagnostic evaluation thatmay be needed or ordered today. Gurman verbalizes understanding of the findings of todays visit. We also reviewed his medications today and discussed drug interactions and side effects including but not limited excessive drowsiness and altered mental states. We also discussed that there is always a risk not just to him but also people around him. he has been encouraged to call the office with any questions or concerns that  should arise related to todays visit.  No orders of the defined types were placed in this encounter.       I have personally obtained a history, examined the patient, evaluated laboratory and imaging results, formulated the assessment and plan and placed orders.  This patient was seen by Taylor Favia, PA-C in collaboration with Dr. Cam Cava as a part of collaborative care agreement.  Cordie Deters, MD [facility] Diplomate ABMS Pulmonary Critical Care Medicine and Sleep Medicine

## 2024-04-02 NOTE — Patient Instructions (Signed)
